# Patient Record
Sex: Female | Born: 2000 | Race: Black or African American | Hispanic: No | Marital: Single
Health system: Southern US, Community
[De-identification: ages and names within clinical notes are randomized; demographics above are authoritative.]

## PROBLEM LIST (undated history)

## (undated) DIAGNOSIS — J45909 Unspecified asthma, uncomplicated: Secondary | ICD-10-CM

---
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
## 2021-06-19 ENCOUNTER — Other Ambulatory Visit: Payer: Self-pay

## 2021-06-19 ENCOUNTER — Emergency Department (HOSPITAL_COMMUNITY)
Admission: EM | Admit: 2021-06-19 | Discharge: 2021-06-20 | Discharge status: Against Medical Advice | Payer: Medicaid Other | Attending: Emergency Medicine | Admitting: Emergency Medicine

## 2021-06-19 ENCOUNTER — Encounter (HOSPITAL_COMMUNITY): Payer: Self-pay | Admitting: Emergency Medicine

## 2021-06-19 DIAGNOSIS — X58XXXA Exposure to other specified factors, initial encounter: Secondary | ICD-10-CM | POA: Insufficient documentation

## 2021-06-19 DIAGNOSIS — Z5321 Procedure and treatment not carried out due to patient leaving prior to being seen by health care provider: Secondary | ICD-10-CM | POA: Insufficient documentation

## 2021-06-19 DIAGNOSIS — S01512A Laceration without foreign body of oral cavity, initial encounter: Secondary | ICD-10-CM | POA: Diagnosis not present

## 2021-06-19 DIAGNOSIS — S0993XA Unspecified injury of face, initial encounter: Secondary | ICD-10-CM | POA: Diagnosis present

## 2021-06-19 NOTE — ED Provider Notes (Signed)
Emergency Medicine Provider Triage Evaluation Note  Lyndall JANAL HAAK , a 20 y.o. female  was evaluated in triage.  Pt complains of tongue laceration. She states that at about 8 PM today she accidentally bit down on her tongue when she was hit on withdrawal.  She denies any other injuries.  Does not take any blood thinners.  Unknown when her last tetanus shot was. She states that it was continuing to bleed and that she can open the wound.  Be.  Review of Systems  Positive: Tongue laceration Negative: headache  Physical Exam  BP (!) 120/91 (BP Location: Right Arm)   Pulse 69   Temp 98 F (36.7 C)   Resp 18   SpO2 100%  Gen:   Awake, no distress   Resp:  Normal effort  MSK:   Moves extremities without difficulty  Other:  There is a approximately 1 to 1.5 cm laceration on the left anterior tongue.  This does not gape however with pressure I am able to open it up and it appears to be through the superficial layers of the tongue.  Scant active bleeding.  Medical Decision Making  Medically screening exam initiated at 10:57 PM.  Appropriate orders placed.  Kierstin LORIEL DIEHL was informed that the remainder of the evaluation will be completed by another provider, this initial triage assessment does not replace that evaluation, and the importance of remaining in the ED until their evaluation is complete.  I discussed options with patient.  We discussed that there can oftentimes be limited benefit and suturing any tongue laceration as typically the tongue heals very quickly.  She is very concerned that it is still continuing to ooze blood.   I did not appreciate significant bleeding on exam.  Patient wants to monitor it to see if it improves.    Cristina Gong, PA-C 06/19/21 2300    Sloan Leiter, DO 06/21/21 0201

## 2021-06-19 NOTE — ED Triage Notes (Signed)
Pt presents stating she bit her tongue earlier.  Bleeding controlled.

## 2021-06-20 NOTE — ED Notes (Signed)
Pt left due to not being seen quick enough
